# Patient Record
Sex: Male | Born: 1970 | Race: White | Hispanic: No | Smoking: Current every day smoker
Health system: Southern US, Community
[De-identification: ages and names within clinical notes are randomized; demographics above are authoritative.]

## PROBLEM LIST (undated history)

## (undated) DIAGNOSIS — R0600 Dyspnea, unspecified: Secondary | ICD-10-CM

## (undated) DIAGNOSIS — M1990 Unspecified osteoarthritis, unspecified site: Secondary | ICD-10-CM

## (undated) DIAGNOSIS — K219 Gastro-esophageal reflux disease without esophagitis: Secondary | ICD-10-CM

## (undated) DIAGNOSIS — K259 Gastric ulcer, unspecified as acute or chronic, without hemorrhage or perforation: Secondary | ICD-10-CM

## (undated) DIAGNOSIS — S62101A Fracture of unspecified carpal bone, right wrist, initial encounter for closed fracture: Secondary | ICD-10-CM

## (undated) HISTORY — PX: LEG SURGERY: SHX1003

## (undated) HISTORY — PX: CARPAL TUNNEL RELEASE: SHX101

## (undated) HISTORY — PX: KNEE SURGERY: SHX244

## (undated) HISTORY — PX: KNEE ARTHROSCOPY: SUR90

## (undated) HISTORY — PX: NECK SURGERY: SHX720

---

## 2016-05-17 ENCOUNTER — Other Ambulatory Visit: Payer: Self-pay | Admitting: Neurosurgery

## 2016-07-11 ENCOUNTER — Other Ambulatory Visit (HOSPITAL_COMMUNITY): Payer: Self-pay

## 2016-08-08 ENCOUNTER — Encounter (HOSPITAL_COMMUNITY): Payer: Self-pay

## 2016-08-08 ENCOUNTER — Encounter (HOSPITAL_COMMUNITY)
Admission: RE | Admit: 2016-08-08 | Discharge: 2016-08-08 | Disposition: A | Discharge status: Home / Self Care | Payer: Medicaid Other | Source: Ambulatory Visit | Attending: Neurosurgery | Admitting: Neurosurgery

## 2016-08-08 DIAGNOSIS — Z0183 Encounter for blood typing: Secondary | ICD-10-CM | POA: Insufficient documentation

## 2016-08-08 DIAGNOSIS — M4310 Spondylolisthesis, site unspecified: Secondary | ICD-10-CM | POA: Diagnosis not present

## 2016-08-08 DIAGNOSIS — Z01812 Encounter for preprocedural laboratory examination: Secondary | ICD-10-CM | POA: Diagnosis not present

## 2016-08-08 HISTORY — DX: Unspecified osteoarthritis, unspecified site: M19.90

## 2016-08-08 HISTORY — DX: Gastric ulcer, unspecified as acute or chronic, without hemorrhage or perforation: K25.9

## 2016-08-08 HISTORY — DX: Fracture of unspecified carpal bone, right wrist, initial encounter for closed fracture: S62.101A

## 2016-08-08 HISTORY — DX: Gastro-esophageal reflux disease without esophagitis: K21.9

## 2016-08-08 HISTORY — DX: Dyspnea, unspecified: R06.00

## 2016-08-08 LAB — BASIC METABOLIC PANEL
Anion gap: 9 (ref 5–15)
BUN: 10 mg/dL (ref 6–20)
CALCIUM: 9.6 mg/dL (ref 8.9–10.3)
CHLORIDE: 101 mmol/L (ref 101–111)
CO2: 27 mmol/L (ref 22–32)
CREATININE: 0.84 mg/dL (ref 0.61–1.24)
Glucose, Bld: 107 mg/dL — ABNORMAL HIGH (ref 65–99)
Potassium: 3.9 mmol/L (ref 3.5–5.1)
Sodium: 137 mmol/L (ref 135–145)

## 2016-08-08 LAB — CBC
HEMATOCRIT: 45 % (ref 39.0–52.0)
HEMOGLOBIN: 15.5 g/dL (ref 13.0–17.0)
MCH: 30.7 pg (ref 26.0–34.0)
MCHC: 34.4 g/dL (ref 30.0–36.0)
MCV: 89.1 fL (ref 78.0–100.0)
Platelets: 277 10*3/uL (ref 150–400)
RBC: 5.05 MIL/uL (ref 4.22–5.81)
RDW: 12.4 % (ref 11.5–15.5)
WBC: 9.3 10*3/uL (ref 4.0–10.5)

## 2016-08-08 LAB — TYPE AND SCREEN
ABO/RH(D): A POS
Antibody Screen: NEGATIVE

## 2016-08-08 LAB — SURGICAL PCR SCREEN
MRSA, PCR: NEGATIVE
STAPHYLOCOCCUS AUREUS: NEGATIVE

## 2016-08-08 LAB — ABO/RH: ABO/RH(D): A POS

## 2016-08-08 NOTE — Pre-Procedure Instructions (Signed)
Providence CrosbyChristopher Edward Watt  08/08/2016      [facility] - [place], [place] - [address] [place] [id number] Phone: [phone] Fax: [phone]    Your procedure is scheduled on   Thursday   08/18/16  [facility].M.  Call this number if you have problems the morning of surgery:  [phone]   Remember:  Do not eat food or drink liquids after midnight.  Take these medicines the morning of surgery with A SIP OF WATER   ALBUTEROL INHALER, OMEPRAZOLE (PRILOSEC), OXYCODONE IF NEEDED  7 days prior to surgery STOP taking any Aspirin, Aleve, Naproxen, Ibuprofen, Motrin, Advil, Goody's, BC's, all herbal medications, fish oil, and all vitamins   Do not wear jewelry, make-up or nail polish.  Do not wear lotions, powders, or perfumes, or deoderant.  Do not shave 48 hours prior to surgery.  Men may shave face and neck.  Do not bring valuables to the hospital.  [facility] any belongings or valuables.  Contacts, dentures or bridgework may not be worn into surgery.  Leave your suitcase in the car.  After surgery it may be brought to your room.  For patients admitted to the hospital, discharge time will be determined by your treatment team.  Patients discharged the day of surgery will not be allowed to drive home.   Name and phone number of your driver:    Special instructions:  [place] - Preparing for Surgery  Before surgery, you can play an important role.  Because skin is not sterile, your skin needs to be as free of germs as possible.  You can reduce the number of germs on you skin by washing with CHG (chlorahexidine gluconate) soap before surgery.  CHG is an antiseptic cleaner which kills germs and bonds with the skin to continue killing germs even after washing.  Please DO NOT use if you have an allergy to CHG or antibacterial soaps.  If your skin becomes reddened/irritated stop using the CHG and  inform your nurse when you arrive at Short Stay.  Do not shave (including legs and underarms) for at least 48 hours prior to the first CHG shower.  You may shave your face.  Please follow these instructions carefully:   1.  Shower with CHG Soap the night before surgery and the                                morning of Surgery.  2.  If you choose to wash your hair, wash your hair first as usual with your       normal shampoo.  3.  After you shampoo, rinse your hair and body thoroughly to remove the                      Shampoo.  4.  Use CHG as you would any other liquid soap.  You can apply chg directly       to the skin and wash gently with scrungie or a clean washcloth.  5.  Apply the CHG Soap to your body ONLY FROM THE NECK DOWN.        Do not use on open wounds or open sores.  Avoid contact with your eyes,       ears, mouth and genitals (private parts).  Wash genitals (private parts)  with your normal soap.  6.  Wash thoroughly, paying special attention to the area where your surgery        will be performed.  7.  Thoroughly rinse your body with warm water from the neck down.  8.  DO NOT shower/wash with your normal soap after using and rinsing off       the CHG Soap.  9.  Pat yourself dry with a clean towel.            10.  Wear clean pajamas.            11.  Place clean sheets on your bed the night of your first shower and do not        sleep with pets.  Day of Surgery  Do not apply any lotions/deoderants the morning of surgery.  Please wear clean clothes to the hospital/surgery center.    Please read over the following fact sheets that you were given. MRSA Information and Surgical Site Infection Prevention

## 2016-08-17 MED ORDER — CEFAZOLIN SODIUM-DEXTROSE 2-4 GM/100ML-% IV SOLN
2.0000 g | INTRAVENOUS | Status: AC
  Administered 2016-08-18: 2 g via INTRAVENOUS
  Filled 2016-08-17: qty 100

## 2016-08-17 MED ORDER — DEXAMETHASONE SODIUM PHOSPHATE 10 MG/ML IJ SOLN
10.0000 mg | INTRAMUSCULAR | Status: AC
  Administered 2016-08-18: 10 mg via INTRAVENOUS
  Filled 2016-08-17: qty 1

## 2016-08-18 ENCOUNTER — Inpatient Hospital Stay (HOSPITAL_COMMUNITY): Payer: Medicaid Other | Admitting: Certified Registered Nurse Anesthetist

## 2016-08-18 ENCOUNTER — Encounter (HOSPITAL_COMMUNITY)
Admission: RE | Disposition: A | Discharge status: Home / Self Care | Payer: Self-pay | Source: Ambulatory Visit | Attending: Neurosurgery

## 2016-08-18 ENCOUNTER — Inpatient Hospital Stay (HOSPITAL_COMMUNITY)
Admission: RE | Admit: 2016-08-18 | Discharge: 2016-08-21 | DRG: 455 | Disposition: A | Discharge status: Home / Self Care | Payer: Medicaid Other | Source: Ambulatory Visit | Attending: Neurosurgery | Admitting: Neurosurgery

## 2016-08-18 ENCOUNTER — Encounter (HOSPITAL_COMMUNITY): Payer: Self-pay | Admitting: Certified Registered Nurse Anesthetist

## 2016-08-18 ENCOUNTER — Inpatient Hospital Stay (HOSPITAL_COMMUNITY): Payer: Medicaid Other

## 2016-08-18 DIAGNOSIS — K219 Gastro-esophageal reflux disease without esophagitis: Secondary | ICD-10-CM | POA: Diagnosis present

## 2016-08-18 DIAGNOSIS — Z79899 Other long term (current) drug therapy: Secondary | ICD-10-CM | POA: Diagnosis not present

## 2016-08-18 DIAGNOSIS — M48061 Spinal stenosis, lumbar region without neurogenic claudication: Secondary | ICD-10-CM | POA: Diagnosis present

## 2016-08-18 DIAGNOSIS — Z8711 Personal history of peptic ulcer disease: Secondary | ICD-10-CM | POA: Diagnosis not present

## 2016-08-18 DIAGNOSIS — M25461 Effusion, right knee: Secondary | ICD-10-CM | POA: Diagnosis present

## 2016-08-18 DIAGNOSIS — F17290 Nicotine dependence, other tobacco product, uncomplicated: Secondary | ICD-10-CM | POA: Diagnosis present

## 2016-08-18 DIAGNOSIS — M419 Scoliosis, unspecified: Secondary | ICD-10-CM | POA: Diagnosis present

## 2016-08-18 DIAGNOSIS — Z79891 Long term (current) use of opiate analgesic: Secondary | ICD-10-CM

## 2016-08-18 DIAGNOSIS — M5136 Other intervertebral disc degeneration, lumbar region: Secondary | ICD-10-CM | POA: Diagnosis present

## 2016-08-18 DIAGNOSIS — F17210 Nicotine dependence, cigarettes, uncomplicated: Secondary | ICD-10-CM | POA: Diagnosis present

## 2016-08-18 DIAGNOSIS — Z419 Encounter for procedure for purposes other than remedying health state, unspecified: Secondary | ICD-10-CM

## 2016-08-18 DIAGNOSIS — M79609 Pain in unspecified limb: Secondary | ICD-10-CM | POA: Diagnosis not present

## 2016-08-18 DIAGNOSIS — R0781 Pleurodynia: Secondary | ICD-10-CM

## 2016-08-18 HISTORY — PX: LUMBAR PERCUTANEOUS PEDICLE SCREW 1 LEVEL: SHX5560

## 2016-08-18 HISTORY — PX: ANTERIOR LAT LUMBAR FUSION: SHX1168

## 2016-08-18 SURGERY — ANTERIOR LATERAL LUMBAR FUSION 1 LEVEL
Anesthesia: General | Site: Spine Lumbar

## 2016-08-18 MED ORDER — OXYCODONE HCL 5 MG PO TABS
10.0000 mg | ORAL_TABLET | Freq: Three times a day (TID) | ORAL | Status: DC
  Administered 2016-08-18 – 2016-08-21 (×8): 10 mg via ORAL
  Filled 2016-08-18 (×9): qty 2

## 2016-08-18 MED ORDER — ONDANSETRON HCL 4 MG PO TABS
4.0000 mg | ORAL_TABLET | Freq: Four times a day (QID) | ORAL | Status: DC | PRN
Start: 2016-08-18 — End: 2016-08-21

## 2016-08-18 MED ORDER — FENTANYL CITRATE (PF) 250 MCG/5ML IJ SOLN
INTRAMUSCULAR | Status: AC
  Filled 2016-08-18: qty 5

## 2016-08-18 MED ORDER — PROMETHAZINE HCL 25 MG/ML IJ SOLN: 6.2500 mg | INTRAMUSCULAR | Status: DC | PRN

## 2016-08-18 MED ORDER — IBUPROFEN 200 MG PO TABS
800.0000 mg | ORAL_TABLET | Freq: Two times a day (BID) | ORAL | Status: DC | PRN
  Administered 2016-08-19: 800 mg via ORAL
  Filled 2016-08-18: qty 4

## 2016-08-18 MED ORDER — FENTANYL CITRATE (PF) 100 MCG/2ML IJ SOLN
INTRAMUSCULAR | Status: DC | PRN
Start: 2016-08-18 — End: 2016-08-18
  Administered 2016-08-18 (×4): 50 ug via INTRAVENOUS
  Administered 2016-08-18: 100 ug via INTRAVENOUS
  Administered 2016-08-18: 50 ug via INTRAVENOUS
  Administered 2016-08-18: 100 ug via INTRAVENOUS

## 2016-08-18 MED ORDER — ACETAMINOPHEN 325 MG PO TABS
650.0000 mg | ORAL_TABLET | ORAL | Status: DC | PRN
  Administered 2016-08-19: 650 mg via ORAL
  Filled 2016-08-18: qty 2

## 2016-08-18 MED ORDER — ONDANSETRON HCL 4 MG/2ML IJ SOLN
INTRAMUSCULAR | Status: AC
  Filled 2016-08-18: qty 2

## 2016-08-18 MED ORDER — SURGIFOAM 100 EX MISC
CUTANEOUS | Status: DC | PRN
  Administered 2016-08-18: 20 mL via TOPICAL

## 2016-08-18 MED ORDER — PROPOFOL 10 MG/ML IV BOLUS
INTRAVENOUS | Status: DC | PRN
  Administered 2016-08-18: 120 mg via INTRAVENOUS

## 2016-08-18 MED ORDER — SODIUM CHLORIDE 0.9 % IV SOLN
250.0000 mL | INTRAVENOUS | Status: DC
  Administered 2016-08-18: 250 mL via INTRAVENOUS

## 2016-08-18 MED ORDER — SUCCINYLCHOLINE CHLORIDE 20 MG/ML IJ SOLN
INTRAMUSCULAR | Status: DC | PRN
  Administered 2016-08-18: 100 mg via INTRAVENOUS

## 2016-08-18 MED ORDER — ONDANSETRON HCL 4 MG/2ML IJ SOLN
INTRAMUSCULAR | Status: DC | PRN
  Administered 2016-08-18: 4 mg via INTRAVENOUS

## 2016-08-18 MED ORDER — LIDOCAINE HCL (CARDIAC) 20 MG/ML IV SOLN
INTRAVENOUS | Status: DC | PRN
  Administered 2016-08-18: 60 mg via INTRAVENOUS

## 2016-08-18 MED ORDER — PANTOPRAZOLE SODIUM 40 MG PO TBEC
40.0000 mg | DELAYED_RELEASE_TABLET | Freq: Every day | ORAL | Status: DC
  Administered 2016-08-19 – 2016-08-21 (×3): 40 mg via ORAL
  Filled 2016-08-18 (×3): qty 1

## 2016-08-18 MED ORDER — SODIUM CHLORIDE 0.9% FLUSH
3.0000 mL | Freq: Two times a day (BID) | INTRAVENOUS | Status: DC
  Administered 2016-08-18: 10 mL via INTRAVENOUS
  Administered 2016-08-21: 3 mL via INTRAVENOUS

## 2016-08-18 MED ORDER — ALBUTEROL SULFATE HFA 108 (90 BASE) MCG/ACT IN AERS: 2.0000 | INHALATION_SPRAY | Freq: Four times a day (QID) | RESPIRATORY_TRACT | Status: DC | PRN

## 2016-08-18 MED ORDER — CYCLOBENZAPRINE HCL 10 MG PO TABS
10.0000 mg | ORAL_TABLET | Freq: Three times a day (TID) | ORAL | Status: DC | PRN
  Administered 2016-08-19 – 2016-08-20 (×2): 10 mg via ORAL
  Filled 2016-08-18 (×2): qty 1

## 2016-08-18 MED ORDER — SUGAMMADEX SODIUM 200 MG/2ML IV SOLN
INTRAVENOUS | Status: DC | PRN
  Administered 2016-08-18: 150 mg via INTRAVENOUS

## 2016-08-18 MED ORDER — HYDROMORPHONE HCL 1 MG/ML IJ SOLN
0.2500 mg | INTRAMUSCULAR | Status: DC | PRN
  Administered 2016-08-18 (×2): 0.5 mg via INTRAVENOUS

## 2016-08-18 MED ORDER — LIDOCAINE-EPINEPHRINE 1 %-1:100000 IJ SOLN
INTRAMUSCULAR | Status: DC | PRN
  Administered 2016-08-18: 8 mL
  Administered 2016-08-18: 2.5 mL

## 2016-08-18 MED ORDER — LACTATED RINGERS IV SOLN
INTRAVENOUS | Status: DC
  Administered 2016-08-18 (×4): via INTRAVENOUS

## 2016-08-18 MED ORDER — PHENYLEPHRINE 40 MCG/ML (10ML) SYRINGE FOR IV PUSH (FOR BLOOD PRESSURE SUPPORT)
PREFILLED_SYRINGE | INTRAVENOUS | Status: AC
  Filled 2016-08-18: qty 10

## 2016-08-18 MED ORDER — PHENOL 1.4 % MT LIQD: 1.0000 | OROMUCOSAL | Status: DC | PRN

## 2016-08-18 MED ORDER — HYDROMORPHONE HCL 1 MG/ML IJ SOLN
INTRAMUSCULAR | Status: AC
  Filled 2016-08-18: qty 0.5

## 2016-08-18 MED ORDER — ACETAMINOPHEN 10 MG/ML IV SOLN
INTRAVENOUS | Status: DC | PRN
  Administered 2016-08-18: 1000 mg via INTRAVENOUS

## 2016-08-18 MED ORDER — ALBUTEROL SULFATE (2.5 MG/3ML) 0.083% IN NEBU: 2.5000 mg | INHALATION_SOLUTION | Freq: Four times a day (QID) | RESPIRATORY_TRACT | Status: DC | PRN

## 2016-08-18 MED ORDER — OXYCODONE HCL 5 MG PO TABS
5.0000 mg | ORAL_TABLET | ORAL | Status: DC | PRN
  Administered 2016-08-19: 5 mg via ORAL
  Administered 2016-08-19 – 2016-08-21 (×7): 10 mg via ORAL
  Filled 2016-08-18 (×8): qty 2

## 2016-08-18 MED ORDER — BUPIVACAINE-EPINEPHRINE 0.25% -1:200000 IJ SOLN
INTRAMUSCULAR | Status: DC | PRN
  Administered 2016-08-18: 2.5 mL

## 2016-08-18 MED ORDER — MIDAZOLAM HCL 5 MG/5ML IJ SOLN
INTRAMUSCULAR | Status: DC | PRN
  Administered 2016-08-18: 2 mg via INTRAVENOUS

## 2016-08-18 MED ORDER — SODIUM CHLORIDE 0.9 % IR SOLN
Status: DC | PRN
  Administered 2016-08-18: 500 mL

## 2016-08-18 MED ORDER — PANTOPRAZOLE SODIUM 40 MG IV SOLR: 40.0000 mg | Freq: Every day | INTRAVENOUS | Status: DC

## 2016-08-18 MED ORDER — CHLORHEXIDINE GLUCONATE CLOTH 2 % EX PADS: 6.0000 | MEDICATED_PAD | Freq: Once | CUTANEOUS | Status: DC

## 2016-08-18 MED ORDER — LAMOTRIGINE 100 MG PO TABS
100.0000 mg | ORAL_TABLET | Freq: Every day | ORAL | Status: DC
  Administered 2016-08-18 – 2016-08-20 (×3): 100 mg via ORAL
  Filled 2016-08-18 (×3): qty 1

## 2016-08-18 MED ORDER — PHENYLEPHRINE HCL 10 MG/ML IJ SOLN
INTRAVENOUS | Status: DC | PRN
  Administered 2016-08-18: 35 ug/min via INTRAVENOUS

## 2016-08-18 MED ORDER — THROMBIN 5000 UNITS EX SOLR
CUTANEOUS | Status: AC
  Filled 2016-08-18: qty 5000

## 2016-08-18 MED ORDER — PROPOFOL 10 MG/ML IV BOLUS
INTRAVENOUS | Status: AC
  Filled 2016-08-18: qty 20

## 2016-08-18 MED ORDER — HYDROMORPHONE HCL 1 MG/ML IJ SOLN
INTRAMUSCULAR | Status: AC
Start: 2016-08-18 — End: 2016-08-19
  Filled 2016-08-18: qty 0.5

## 2016-08-18 MED ORDER — LIDOCAINE-EPINEPHRINE 1 %-1:100000 IJ SOLN
INTRAMUSCULAR | Status: AC
  Filled 2016-08-18: qty 1

## 2016-08-18 MED ORDER — HYDROMORPHONE HCL 1 MG/ML IJ SOLN
0.5000 mg | INTRAMUSCULAR | Status: DC | PRN
  Administered 2016-08-18 – 2016-08-21 (×7): 0.5 mg via INTRAVENOUS
  Filled 2016-08-18 (×7): qty 0.5

## 2016-08-18 MED ORDER — FLUTICASONE PROPIONATE 50 MCG/ACT NA SUSP
1.0000 | Freq: Every day | NASAL | Status: DC
  Administered 2016-08-19 – 2016-08-21 (×3): 1 via NASAL
  Filled 2016-08-18 (×2): qty 16

## 2016-08-18 MED ORDER — BUPIVACAINE HCL (PF) 0.25 % IJ SOLN
INTRAMUSCULAR | Status: AC
  Filled 2016-08-18: qty 30

## 2016-08-18 MED ORDER — 0.9 % SODIUM CHLORIDE (POUR BTL) OPTIME
TOPICAL | Status: DC | PRN
  Administered 2016-08-18: 1000 mL

## 2016-08-18 MED ORDER — OXYCODONE HCL 10 MG PO TABS: 10.0000 mg | ORAL_TABLET | Freq: Three times a day (TID) | ORAL | Status: DC

## 2016-08-18 MED ORDER — ONDANSETRON HCL 4 MG/2ML IJ SOLN: 4.0000 mg | Freq: Four times a day (QID) | INTRAMUSCULAR | Status: DC | PRN

## 2016-08-18 MED ORDER — ROCURONIUM BROMIDE 100 MG/10ML IV SOLN
INTRAVENOUS | Status: DC | PRN
  Administered 2016-08-18: 25 mg via INTRAVENOUS

## 2016-08-18 MED ORDER — ACETAMINOPHEN 650 MG RE SUPP: 650.0000 mg | RECTAL | Status: DC | PRN

## 2016-08-18 MED ORDER — CEFAZOLIN SODIUM-DEXTROSE 2-4 GM/100ML-% IV SOLN
2.0000 g | Freq: Three times a day (TID) | INTRAVENOUS | Status: AC
  Administered 2016-08-18 – 2016-08-19 (×2): 2 g via INTRAVENOUS
  Filled 2016-08-18 (×2): qty 100

## 2016-08-18 MED ORDER — ACETAMINOPHEN 10 MG/ML IV SOLN
INTRAVENOUS | Status: AC
  Filled 2016-08-18: qty 100

## 2016-08-18 MED ORDER — ALUM & MAG HYDROXIDE-SIMETH 200-200-20 MG/5ML PO SUSP: 30.0000 mL | Freq: Four times a day (QID) | ORAL | Status: DC | PRN

## 2016-08-18 MED ORDER — KETAMINE HCL-SODIUM CHLORIDE 100-0.9 MG/10ML-% IV SOSY
PREFILLED_SYRINGE | INTRAVENOUS | Status: AC
  Filled 2016-08-18: qty 10

## 2016-08-18 MED ORDER — MIDAZOLAM HCL 2 MG/2ML IJ SOLN
INTRAMUSCULAR | Status: AC
  Filled 2016-08-18: qty 2

## 2016-08-18 MED ORDER — MENTHOL 3 MG MT LOZG: 1.0000 | LOZENGE | OROMUCOSAL | Status: DC | PRN

## 2016-08-18 MED ORDER — KETAMINE HCL 10 MG/ML IJ SOLN
INTRAMUSCULAR | Status: DC | PRN
  Administered 2016-08-18 (×3): 10 mg via INTRAVENOUS
  Administered 2016-08-18: 40 mg via INTRAVENOUS

## 2016-08-18 MED ORDER — SODIUM CHLORIDE 0.9% FLUSH: 3.0000 mL | INTRAVENOUS | Status: DC | PRN

## 2016-08-18 MED ORDER — THROMBIN 20000 UNITS EX SOLR
CUTANEOUS | Status: AC
  Filled 2016-08-18: qty 20000

## 2016-08-18 SURGICAL SUPPLY — 82 items
BAG DECANTER FOR FLEXI CONT (MISCELLANEOUS) ×3 IMPLANT
BENZOIN TINCTURE PRP APPL 2/3 (GAUZE/BANDAGES/DRESSINGS) ×3 IMPLANT
BLADE CLIPPER SURG (BLADE) IMPLANT
BONE MATRIX OSTEOCEL PRO MED (Bone Implant) ×6 IMPLANT
BUR MATCHSTICK NEURO 3.0 LAGG (BURR) ×3 IMPLANT
CAP RELINE MOD TULIP RMM (Cap) ×6 IMPLANT
CARTRIDGE OIL MAESTRO DRILL (MISCELLANEOUS) ×1 IMPLANT
CLOSURE WOUND 1/2 X4 (GAUZE/BANDAGES/DRESSINGS) ×1
CONT SPEC 4OZ CLIKSEAL STRL BL (MISCELLANEOUS) ×3 IMPLANT
COVER BACK TABLE 24X17X13 BIG (DRAPES) IMPLANT
COVER BACK TABLE 60X90IN (DRAPES) ×3 IMPLANT
DERMABOND ADVANCED (GAUZE/BANDAGES/DRESSINGS) ×4
DERMABOND ADVANCED .7 DNX12 (GAUZE/BANDAGES/DRESSINGS) ×2 IMPLANT
DIFFUSER DRILL AIR PNEUMATIC (MISCELLANEOUS) ×3 IMPLANT
DRAPE C-ARM 42X72 X-RAY (DRAPES) ×6 IMPLANT
DRAPE C-ARMOR (DRAPES) ×6 IMPLANT
DRAPE LAPAROTOMY 100X72X124 (DRAPES) ×6 IMPLANT
DRAPE POUCH INSTRU U-SHP 10X18 (DRAPES) IMPLANT
DRAPE SURG 17X23 STRL (DRAPES) ×6 IMPLANT
DRSG OPSITE 4X5.5 SM (GAUZE/BANDAGES/DRESSINGS) ×3 IMPLANT
DRSG OPSITE POSTOP 4X6 (GAUZE/BANDAGES/DRESSINGS) ×9 IMPLANT
ELECT BLADE 4.0 EZ CLEAN MEGAD (MISCELLANEOUS) ×3
ELECT REM PT RETURN 9FT ADLT (ELECTROSURGICAL) ×6
ELECTRODE BLDE 4.0 EZ CLN MEGD (MISCELLANEOUS) ×1 IMPLANT
ELECTRODE REM PT RTRN 9FT ADLT (ELECTROSURGICAL) ×2 IMPLANT
EVACUATOR 1/8 PVC DRAIN (DRAIN) ×3 IMPLANT
GAUZE SPONGE 4X4 12PLY STRL (GAUZE/BANDAGES/DRESSINGS) IMPLANT
GAUZE SPONGE 4X4 16PLY XRAY LF (GAUZE/BANDAGES/DRESSINGS) IMPLANT
GLOVE BIO SURGEON STRL SZ7 (GLOVE) ×3 IMPLANT
GLOVE BIO SURGEON STRL SZ8 (GLOVE) ×12 IMPLANT
GLOVE BIO SURGEON STRL SZ8.5 (GLOVE) ×6 IMPLANT
GLOVE BIOGEL PI IND STRL 7.0 (GLOVE) ×1 IMPLANT
GLOVE BIOGEL PI IND STRL 7.5 (GLOVE) ×3 IMPLANT
GLOVE BIOGEL PI INDICATOR 7.0 (GLOVE) ×2
GLOVE BIOGEL PI INDICATOR 7.5 (GLOVE) ×6
GLOVE ECLIPSE 7.0 STRL STRAW (GLOVE) ×9 IMPLANT
GLOVE EXAM NITRILE LRG STRL (GLOVE) IMPLANT
GLOVE EXAM NITRILE XL STR (GLOVE) IMPLANT
GLOVE EXAM NITRILE XS STR PU (GLOVE) IMPLANT
GLOVE INDICATOR 8.5 STRL (GLOVE) ×6 IMPLANT
GLOVE SURG SS PI 7.0 STRL IVOR (GLOVE) ×3 IMPLANT
GOWN STRL REUS W/ TWL LRG LVL3 (GOWN DISPOSABLE) ×2 IMPLANT
GOWN STRL REUS W/ TWL XL LVL3 (GOWN DISPOSABLE) ×2 IMPLANT
GOWN STRL REUS W/TWL 2XL LVL3 (GOWN DISPOSABLE) ×6 IMPLANT
GOWN STRL REUS W/TWL LRG LVL3 (GOWN DISPOSABLE) ×4
GOWN STRL REUS W/TWL XL LVL3 (GOWN DISPOSABLE) ×4
IMPL COROENT XL 8X8X45 (Cage) ×1 IMPLANT
IMPLANT COROENT XL 8X8X45 (Cage) ×3 IMPLANT
KIT BASIN OR (CUSTOM PROCEDURE TRAY) ×3 IMPLANT
KIT DILATOR XLIF 5 (KITS) ×1 IMPLANT
KIT ROOM TURNOVER OR (KITS) ×3 IMPLANT
KIT SURGICAL ACCESS MAXCESS 4 (KITS) ×3 IMPLANT
KIT XLIF (KITS) ×2
MODULE NVM5 NEXT GEN EMG (NEEDLE) ×3 IMPLANT
NEEDLE HYPO 22GX1.5 SAFETY (NEEDLE) ×3 IMPLANT
NEEDLE HYPO 25X1 1.5 SAFETY (NEEDLE) ×3 IMPLANT
NS IRRIG 1000ML POUR BTL (IV SOLUTION) ×3 IMPLANT
OIL CARTRIDGE MAESTRO DRILL (MISCELLANEOUS) ×3
PACK LAMINECTOMY NEURO (CUSTOM PROCEDURE TRAY) ×6 IMPLANT
ROD RELINE O COCR 5.0X50MM (Rod) ×3 IMPLANT
ROD RELINE-O COCR 5.0X55MM (Rod) ×3 IMPLANT
SCREW 5X30MM POLYAXIAL 4S (Screw) ×6 IMPLANT
SCREW LOCK RSS 4.5/5.0MM (Screw) ×12 IMPLANT
SCREW SHANK RELINE MOD 5.0X30 (Screw) ×6 IMPLANT
SPONGE LAP 4X18 X RAY DECT (DISPOSABLE) IMPLANT
SPONGE SURGIFOAM ABS GEL SZ50 (HEMOSTASIS) IMPLANT
STAPLER SKIN PROX WIDE 3.9 (STAPLE) IMPLANT
STRIP CLOSURE SKIN 1/2X4 (GAUZE/BANDAGES/DRESSINGS) ×2 IMPLANT
SUT VIC AB 0 CT1 18XCR BRD8 (SUTURE) ×1 IMPLANT
SUT VIC AB 0 CT1 8-18 (SUTURE) ×2
SUT VIC AB 2-0 CT1 18 (SUTURE) ×12 IMPLANT
SUT VIC AB 2-0 CT1 27 (SUTURE) ×4
SUT VIC AB 2-0 CT1 TAPERPNT 27 (SUTURE) ×2 IMPLANT
SUT VIC AB 4-0 P-3 18X BRD (SUTURE) IMPLANT
SUT VIC AB 4-0 P3 18 (SUTURE)
SUT VIC AB 4-0 PS2 27 (SUTURE) ×9 IMPLANT
SUT VICRYL 4-0 PS2 18IN ABS (SUTURE) IMPLANT
SWABSTICK BENZOIN STERILE (MISCELLANEOUS) IMPLANT
TOWEL GREEN STERILE (TOWEL DISPOSABLE) ×6 IMPLANT
TOWEL GREEN STERILE FF (TOWEL DISPOSABLE) ×6 IMPLANT
TRAY FOLEY W/METER SILVER 16FR (SET/KITS/TRAYS/PACK) ×3 IMPLANT
WATER STERILE IRR 1000ML POUR (IV SOLUTION) ×3 IMPLANT

## 2016-08-18 NOTE — Op Note (Signed)
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   Postoperative diagnosis: Same.  Procedure: #1 anterolateral interbody fusion through a left-sided approach at L1-L2 utilizing the nuvasive XLIF anterolateral system with placement of an 8 mm parallel cage packed with Osteocel pro with intraoperative neural monitoring  #2 through separate skin incision patient was repositioned posteriorly for cortical screw placement utilizing the cobalt chrome new invasive cortical screw set at L1-L2 with posterior lateral arthrodesis L1-L2  Surgeon: Jillyn Hidden Nivan Melendrez  Asst.: Tressie Stalker  Anesthesia: Gen.  EBL: Minimal  History of present illness: Patient is a 46 year old some is a long-standing back pain progressive worsening over last several years refractory to all forms conservative treatment imaging revealed progressive degenerative collapse at L1-L2 with scoliotic deformity Modic changes and spinal stenosis. Due to patient's progression of clinical syndrome imaging findings and failure conservative treatment I recommended anterior lateral interbody fusion with posterior augmentation with cortical screws extensively went over the risks and benefits of the operation with him as well as perioperative course expectations of outcome and alternatives of surgery and he understood and agreed to proceed forward.  Operative procedure: Patient brought into the or was initially induced under general anesthesia without muscle relaxer patient was positioned left side up in the lateral position utilizing fluoroscopy we identified the L1-L2 disc space position the patient in slight flexion to open up the L1-L2 disc space on the left. After adequate imaging a been achieved the localization 2 incisions were made one posteriorly 1 laterally to gain access to the right.. I into the posterior incision identified transverse process sweep the  retroperitoneum and pleura off the undersurface of both the 12th and the 11th rib and then passing the dilator over my finger back down into the paraspinal musculature and utilizing fluoroscopy I entered and then selected the posterior aspect of the L1-2 disc space. Inserted the Steinmann pin and then used sequential distraction with dilators utilizing intraoperative neural monitoring during the entire placement of each dilator no readings came in less than 20. Then retractor was selected and this was again placed and anchored in place opened up the retractor inspected the Steinmann pin utilizing AP and lateral fluoroscopy I selected a posterior entry point for the shim and confirmed good position with fluoroscopy. Stimulated the entire area around the operative field again with no readings that were less than 20. Then incised the disc space and clean the disc space out due to rongeurs utilized small Cobbs to release the contralateral annulus. Then after adequate discectomy was achieved utilizing paddle opened up the disc space and reduce the scoliotic deformity utilizing an 8 parallel trial this felt to be adequate scoliotic reduction of the deformity and also opened up the disc space adequately. So at the size of cage I selected and packed with the osteo-cell pro. Then inserted this with utilizing slides under fluoroscopy until it was in good position. There removed the slides confirmed adequate position with fluoroscopy inspected the operative field and saw no violation of the pleura or retroperitoneum wound was copious irrigated meticulous hemostasis was maintained both incisions were closed with with interrupted Vicryl and a running 4 subcuticular. Wounds were then dressed the patient was then repositioned for the posterior part of the case.  Once the patient was positioned prone on the Wilson frame localize film proved and confirmed localization of the L1-2 disc space I infiltration of 10 mL lidocaine with  epi a midline incision was made and  subperiosteal dissections care lamina of L1 and L2 I then identified the entry points at the L1-L2 pedicle utilizing AP and lateral fluoroscopy then all cortical screw holes were drilled to 25 mm tapped probed and 50 by 30 mm cortical screws were placed at L1 and L2 bilaterally. All screws excellent purchase. Ross be confirmed good position of all the screws I then aggressively decorticated the facet complex and lateral pars and packed the osteo-cell pro posterior laterally bilaterally. Then assembled ahead and 2 rods were selected all the knots were anchored in place a medium Hemovac drain was placed the wound scope was irrigated and the wounds closed in layers without Vicryl and a running 4 septic or Dermabond benzo and Steri-Strips and sterile dressings applied patient recovered in stable condition. At the end of case all needle counts and sponge counts were correct.

## 2016-08-18 NOTE — OR Nursing (Signed)
When patient awakened for pain assessment, he stated he was a 7/10.  His HR 70, BP 124/83, RR 12 and oxygen saturations 99% on 2L [place].  Pt immediately falls back asleep.  Pupils are pinpoint. No further narcotics will be given at this time.

## 2016-08-18 NOTE — Anesthesia Preprocedure Evaluation (Signed)
Anesthesia Evaluation  Patient identified by MRN, date of birth, ID band Patient awake    Reviewed: Allergy & Precautions, NPO status , Patient's Chart, lab work & pertinent test results  Airway Mallampati: II  TM Distance: >3 FB Neck ROM: Full    Dental no notable dental hx.    Pulmonary Current Smoker,    Pulmonary exam normal breath sounds clear to auscultation       Cardiovascular negative cardio ROS Normal cardiovascular exam Rhythm:Regular Rate:Normal     Neuro/Psych negative neurological ROS  negative psych ROS   GI/Hepatic Neg liver ROS, GERD  Medicated,  Endo/Other  negative endocrine ROS  Renal/GU negative Renal ROS  negative genitourinary   Musculoskeletal negative musculoskeletal ROS (+)   Abdominal   Peds negative pediatric ROS (+)  Hematology negative hematology ROS (+)   Anesthesia Other Findings   Reproductive/Obstetrics negative OB ROS                             Anesthesia Physical Anesthesia Plan  ASA: II  Anesthesia Plan: General   Post-op Pain Management:    Induction: Intravenous  PONV Risk Score and Plan: 1 and Ondansetron and Dexamethasone  Airway Management Planned: Oral ETT  Additional Equipment:   Intra-op Plan:   Post-operative Plan: Extubation in OR  Informed Consent: I have reviewed the patients History and Physical, chart, labs and discussed the procedure including the risks, benefits and alternatives for the proposed anesthesia with the patient or authorized representative who has indicated his/her understanding and acceptance.   Dental advisory given  Plan Discussed with: CRNA and Surgeon  Anesthesia Plan Comments:         Anesthesia Quick Evaluation

## 2016-08-18 NOTE — Transfer of Care (Signed)
Immediate Anesthesia Transfer of Care Note  Patient: [name]  Procedure(s) Performed: Procedure(s): Lumbar One-Two ANTERIOR LATERAL LUMBAR FUSION with Cortical Screws (N/A) LUMBAR PERCUTANEOUS PEDICLE SCREW LUMBAR ONE-TWO (N/A)  Patient Location: PACU  Anesthesia Type:General  Level of Consciousness: awake  Airway & Oxygen Therapy: Patient Spontanous Breathing and Patient connected to face mask oxygen  Post-op Assessment: Report given to RN, Post -op Vital signs reviewed and stable and Patient moving all extremities X 4  Post vital signs: Reviewed and stable  Last Vitals:  Vitals:   08/18/16 1049  BP: 130/74  Pulse: 93  Resp: 18  Temp: 36.8 C    Last Pain:  Vitals:   08/18/16 1845  PainSc: (P) Asleep         Complications: No apparent anesthesia complications

## 2016-08-18 NOTE — Anesthesia Procedure Notes (Signed)
Procedure Name: Intubation Date/Time: 08/18/2016 2:24 PM Performed by: Little IshikawaMERCER, Thuan Tippett L Pre-anesthesia Checklist: Patient identified, Emergency Drugs available, Suction available and Patient being monitored Patient Re-evaluated:Patient Re-evaluated prior to inductionOxygen Delivery Method: Circle System Utilized Preoxygenation: Pre-oxygenation with 100% oxygen Intubation Type: IV induction Ventilation: Mask ventilation without difficulty Laryngoscope Size: Glidescope and 4 Grade View: Grade I Tube type: Oral Tube size: 7.5 mm Number of attempts: 1 Airway Equipment and Method: Stylet and Oral airway Placement Confirmation: ETT inserted through vocal cords under direct vision,  positive ETCO2 and breath sounds checked- equal and bilateral Secured at: 22 cm Tube secured with: Tape Dental Injury: Teeth and Oropharynx as per pre-operative assessment  Difficulty Due To: Difficulty was anticipated, Difficult Airway- due to reduced neck mobility and Difficult Airway- due to limited oral opening

## 2016-08-18 NOTE — H&P (Signed)
Nathan [name] is an 46 y.o. male.   Chief Complaint: Back HPI: A 46 year old gentleman with a long-standing history with his back severe upper lumbar pain with some occasional radiation the both hips. We'll follow this for several years undergone physical therapy epidural steroid injection facet blocks and serial imaging showed progressive degeneration and collapse with instability and scoliotic deformity at L1-L2. Due to patient's failure conservative treatment imaging findings and progression of clinical syndrome I recommended anterior lateral interbody fusion L1-L2 with posterior augmentation with cortical screws. I've extensively gone over the risks and benefits of the procedure with him as well as perioperative course expectations of outcome and alternatives surgery and he understands and agrees to proceed forward.  Past Medical History:  Diagnosis Date  . Dyspnea   . GERD (gastroesophageal reflux disease)   . Multiple gastric ulcers   . Osteoarthritis   . Wrist fracture, right     Past Surgical History:  Procedure Laterality Date  . CARPAL TUNNEL RELEASE Right   . KNEE ARTHROSCOPY Right   . KNEE SURGERY Left    46 years old  . LEG SURGERY Right    hardware placed  . NECK SURGERY      Family History  Problem Relation Age of Onset  . Diabetes Mother   . Lung cancer Father   . Coronary artery disease Father   . Heart attack Father    Social History:  reports that he has been smoking Cigarettes and Cigars.  He has a 35.00 pack-year smoking history. He has never used smokeless tobacco. He reports that he drinks alcohol. He reports that he does not use drugs.  Allergies:  Allergies  Allergen Reactions  . No Known Allergies     Medications Prior to Admission  Medication Sig Dispense Refill  . albuterol (PROVENTIL HFA;VENTOLIN HFA) 108 (90 Base) MCG/ACT inhaler Inhale 2 puffs into the lungs every 6 (six) hours as needed for wheezing or shortness of breath.    .  fluticasone (FLONASE) 50 MCG/ACT nasal spray Place 1 spray into both nostrils daily.    Marland Kitchen. ibuprofen (ADVIL,MOTRIN) 800 MG tablet Take 800 mg by mouth 2 (two) times daily as needed for mild pain. Takes 1 every morning and [name] take the second dose if he is pain    . lamoTRIgine (LAMICTAL) 100 MG tablet Take 100 mg by mouth at bedtime.    Marland Kitchen. omeprazole (PRILOSEC) 40 MG capsule Take 40 mg by mouth daily.    . Oxycodone HCl 10 MG TABS Take 10 mg by mouth 3 (three) times daily.      No results found for this or any previous visit (from the past 48 hour(s)). No results found.  Review of Systems  Musculoskeletal: Positive for back pain and myalgias.  Neurological: Positive for tingling and sensory change.    Blood pressure 130/74, pulse 93, temperature 98.3 F (36.8 C), resp. rate 18, weight 66.2 kg (146 lb), SpO2 97 %. Physical Exam  Constitutional: He is oriented to person, place, and time. He appears well-developed.  HENT:  Head: Normocephalic.  Eyes: Pupils are equal, round, and reactive to light.  Neck: Normal range of motion.  GI: Soft.  Neurological: He is alert and oriented to person, place, and time. He has normal strength. GCS eye subscore is 4. GCS verbal subscore is 5. GCS motor subscore is 6.  Strength 5 out of 5 iliopsoas, quads, hamstrings, gastric, into tibialis, and EHL.  Skin: Skin is warm and dry.  Assessment/Plan 46 year old gentleman presents for anterolateral interbody fusion with cortical screws at L1-L2  Nathan Vacca P, MD 08/18/2016, 2:03 PM

## 2016-08-19 ENCOUNTER — Encounter (HOSPITAL_COMMUNITY): Payer: Self-pay | Admitting: Neurosurgery

## 2016-08-19 NOTE — Evaluation (Signed)
Physical Therapy Evaluation Patient Details Name: Nathan [name] MRN: [id number] DOB: 1970-05-03 Today's Date: 08/19/2016   History of Present Illness  Pt is a 46 y/o male s/p anterolateral interbody fusion through a L sided approach at L1-L2. PMH including but not limited to OA and chronic back pain.  Clinical Impression  Pt presented supine in bed with HOB elevated, awake and willing to participate in therapy session. Prior to admission, pt reported that he was independent with all functional mobility and ADLs. Pt ambulated in hallway with supervision without an AD, no instability or LOB. Pt also successfully completed stair training with no concerns. PT reviewed 3/3 back precautions with pt throughout session. No further acute PT needs identified at this time. PT signing off.    Follow Up Recommendations Outpatient PT;Other (comment) (pt reported having OP PT appointments set up)    Equipment Recommendations  None recommended by PT    Recommendations for Other Services       Precautions / Restrictions Precautions Precautions: Back Precaution Booklet Issued: No Precaution Comments: PT reviewed 3/3 back precautions with pt and log roll technique for bed mobility Required Braces or Orthoses: Spinal Brace Spinal Brace: Lumbar corset;Applied in sitting position Restrictions Weight Bearing Restrictions: No      Mobility  Bed Mobility Overal bed mobility: Needs Assistance Bed Mobility: Rolling;Sidelying to Sit;Sit to Sidelying Rolling: Supervision Sidelying to sit: Supervision     Sit to sidelying: Supervision General bed mobility comments: increased time, cueing for log roll, supervision for safety  Transfers Overall transfer level: Needs assistance Equipment used: None Transfers: Sit to/from Stand Sit to Stand: Supervision         General transfer comment: increased time and effort, supervision for safety  Ambulation/Gait Ambulation/Gait assistance:  Supervision Ambulation Distance (Feet): 300 Feet Assistive device: None Gait Pattern/deviations: Step-through pattern Gait velocity: decreased Gait velocity interpretation: Below normal speed for age/gender General Gait Details: no instability or LOB, supervision for safety  Stairs Stairs: Yes Stairs assistance: Supervision Stair Management: Two rails;Alternating pattern;Forwards Number of Stairs: 2    Wheelchair Mobility    Modified Rankin (Stroke Patients Only)       Balance Overall balance assessment: Needs assistance Sitting-balance support: Feet supported Sitting balance-Leahy Scale: Good     Standing balance support: During functional activity;No upper extremity supported Standing balance-Leahy Scale: Good                               Pertinent Vitals/Pain Pain Assessment: 0-10 Pain Score: 7  Pain Location: back Pain Descriptors / Indicators: Sore Pain Intervention(s): Monitored during session;Repositioned    Home Living Family/patient expects to be discharged to:: Private residence Living Arrangements: Parent Available Help at Discharge: Family;Available 24 hours/day Type of Home: House Home Access: Level entry     Home Layout: Other (Comment) (3 steps to get from den to [employer]) Home Equipment: Dan Humphreys - 2 wheels;Cane - single point;Crutches;Bedside commode;Wheelchair - manual      Prior Function Level of Independence: Independent               Hand Dominance        Extremity/Trunk Assessment   Upper Extremity Assessment Upper Extremity Assessment: Defer to OT evaluation    Lower Extremity Assessment Lower Extremity Assessment: Overall WFL for tasks assessed    Cervical / Trunk Assessment Cervical / Trunk Assessment: Other exceptions Cervical / Trunk Exceptions: s/p lumbar sx  Communication   Communication:  No difficulties  Cognition Arousal/Alertness: Awake/alert Behavior During Therapy: WFL for tasks  assessed/performed Overall Cognitive Status: Within Functional Limits for tasks assessed                                        General Comments      Exercises     Assessment/Plan    PT Assessment Patent does not need any further PT services  PT Problem List         PT Treatment Interventions      PT Goals (Current goals can be found in the Care Plan section)  Acute Rehab PT Goals Patient Stated Goal: return home    Frequency     Barriers to discharge        Co-evaluation               AM-PAC PT "6 Clicks" Daily Activity  Outcome Measure Difficulty turning over in bed (including adjusting bedclothes, sheets and blankets)?: A Little Difficulty moving from lying on back to sitting on the side of the bed? : A Little Difficulty sitting down on and standing up from a chair with arms (e.g., wheelchair, bedside commode, etc,.)?: A Little Help needed moving to and from a bed to chair (including a wheelchair)?: None Help needed walking in hospital room?: None Help needed climbing 3-5 steps with a railing? : None 6 Click Score: 21    End of Session Equipment Utilized During Treatment: Gait belt;Back brace Activity Tolerance: Patient tolerated treatment well Patient left: in bed;with call bell/phone within reach;with bed alarm set Nurse Communication: Mobility status PT Visit Diagnosis: Other abnormalities of gait and mobility (R26.89);Pain Pain - part of body:  (back)    Time: [id number] PT Time Calculation (min) (ACUTE ONLY): 28 min   Charges:   PT Evaluation $PT Eval Moderate Complexity: 1 Procedure PT Treatments $Gait Training: 8-22 mins   PT G Codes:        [place] Pal[place] VerdesJennifer Yoselyn Mcglade, PT, DPT [phone]   Alessandra BevelsJennifer M Harrington Jobe 08/19/2016, 3:51 PM

## 2016-08-19 NOTE — Progress Notes (Signed)
Subjective: Patient reports Patient is very well condition of back pain no leg pain  Objective: Vital signs in last 24 hours: Temp:  [97.7 F (36.5 C)-98.5 F (36.9 C)] 98.5 F (36.9 C) (06/22 0942) Pulse Rate:  [69-83] 73 (06/22 0942) Resp:  [10-20] 20 (06/22 0942) BP: (111-138)/(66-96) 117/76 (06/22 0942) SpO2:  [92 %-100 %] 99 % (06/22 0942) Weight:  [66 kg (145 lb 8.1 oz)] 66 kg (145 lb 8.1 oz) (06/21 2035)  Intake/Output from previous day: 06/21 0701 - 06/22 0700 In: 2971 [P.O.:360; I.V.:2511; IV Piggyback:100] Out: 3425 [Urine:3250; Drains:50; Blood:125] Intake/Output this shift: No intake/output data recorded.  Strength out of 5 wound is clean dry and intact  Lab Results: No results for input(s): WBC, HGB, HCT, PLT in the last 72 hours. BMET No results for input(s): NA, K, CL, CO2, GLUCOSE, BUN, CREATININE, CALCIUM in the last 72 hours.  Studies/Results: Dg Lumbar Spine 2-3 Views  Result Date: 08/18/2016 CLINICAL DATA:  L1-L2 XLIF EXAM: LUMBAR SPINE - 2-3 VIEW; DG C-ARM GT 120 MIN COMPARISON:  MRI lumbar spine 04/07/2016, chest radiograph 02/06/2010 FLUOROSCOPY TIME:  3 minutes 0 seconds Images obtained:  7 FINDINGS: Five lumbar vertebra labeled on the preoperative MR exam. Twelve pairs of ribs on remote chest exam 02/06/2010. Submitted images demonstrate placement of a disc prosthesis and BILATERAL pedicle screws at L1-L2. Endplate sclerosis and tiny endplate spurs at Z6-X01-L2. Bones appear demineralized. No bone destruction. Mild retrolisthesis at L1-L2, corresponding to prior MR. IMPRESSION: Intraoperative images during posterior fusion of L1-L2. Electronically Signed   By: Ulyses SouthwardMark  Boles M.D.   On: 08/18/2016 18:51   Dg C-arm Gt 120 Min  Result Date: 08/18/2016 CLINICAL DATA:  L1-L2 XLIF EXAM: LUMBAR SPINE - 2-3 VIEW; DG C-ARM GT 120 MIN COMPARISON:  MRI lumbar spine 04/07/2016, chest radiograph 02/06/2010 FLUOROSCOPY TIME:  3 minutes 0 seconds Images obtained:  7 FINDINGS:  Five lumbar vertebra labeled on the preoperative MR exam. Twelve pairs of ribs on remote chest exam 02/06/2010. Submitted images demonstrate placement of a disc prosthesis and BILATERAL pedicle screws at L1-L2. Endplate sclerosis and tiny endplate spurs at R6-E41-L2. Bones appear demineralized. No bone destruction. Mild retrolisthesis at L1-L2, corresponding to prior MR. IMPRESSION: Intraoperative images during posterior fusion of L1-L2. Electronically Signed   By: Ulyses SouthwardMark  Boles M.D.   On: 08/18/2016 18:51    Assessment/Plan: Continue to mobilize with physical and occupational therapy probable discharge in a.m.  LOS: 1 day     Nathan [name] P 08/19/2016, 10:54 AM

## 2016-08-19 NOTE — Evaluation (Signed)
Occupational Therapy Evaluation Patient Details Name: Nathan [name] MRN: [id number] DOB: 02-06-1971 Today's Date: 08/19/2016    History of Present Illness Pt is a 46 y/o male s/p anterolateral interbody fusion through a L sided approach at L1-L2. PMH including but not limited to OA and chronic back pain.   Clinical Impression   This 46 y/o M presents with the above. At baseline Pt is independent with ADLs and functional mobility. Pt currently requires supervision for functional mobility and MinA for LB ADLs secondary to adhering to back precautions. Pt demonstrates good understanding and follow through of precautions, with education provided on DME, AE, and compensatory techniques for safely completing ADLs. Pt will have 24 hr family assist after discharge home. All education provided and questions answered. No further acute OT needs identified at this time. Will sign off.     Follow Up Recommendations  No OT follow up;Supervision/Assistance - 24 hour    Equipment Recommendations  None recommended by OT           Precautions / Restrictions Precautions Precautions: Back Precaution Booklet Issued: Yes (comment) Precaution Comments: reviewed 3/3 back precautions with pt and log roll technique for bed mobility; issued handout  Required Braces or Orthoses: Spinal Brace Spinal Brace: Lumbar corset;Applied in sitting position Restrictions Weight Bearing Restrictions: No      Mobility Bed Mobility Overal bed mobility: Needs Assistance Bed Mobility: Rolling;Sit to Sidelying Rolling: Supervision Sidelying to sit: Supervision     Sit to sidelying: Supervision General bed mobility comments: increased time, cueing for log roll, supervision for safety  Transfers Overall transfer level: Needs assistance Equipment used: None Transfers: Sit to/from Stand Sit to Stand: Supervision         General transfer comment: increased time and effort, supervision for safety     Balance Overall balance assessment: Needs assistance Sitting-balance support: Feet supported Sitting balance-Leahy Scale: Good     Standing balance support: During functional activity;No upper extremity supported Standing balance-Leahy Scale: Good Standing balance comment: Pt demonstrating functional mobility rolling iv pole in room with supervision, no LOB                            ADL either performed or assessed with clinical judgement   ADL Overall ADL's : Needs assistance/impaired Eating/Feeding: Set up;Sitting   Grooming: Wash/dry hands;Min guard;Standing   Upper Body Bathing: Min guard;Sitting   Lower Body Bathing: Sit to/from stand;Min guard   Upper Body Dressing : Min guard;Sitting   Lower Body Dressing: Sit to/from stand;Min guard Lower Body Dressing Details (indicate cue type and reason): Pt demonstrates figure 4 technique to don/doff socks  Toilet Transfer: Min guard;Ambulation;Regular Toilet;Grab bars Toilet Transfer Details (indicate cue type and reason): Pt has BSC at home, educated pt on using DME to increase safety/stability during toilet transfer at home  Toileting- ArchitectClothing Manipulation and Hygiene: Min guard;Sit to/from Nurse, children'sstand     Tub/Shower Transfer Details (indicate cue type and reason): Educated Pt on transfer technique for tub transfer; educated Pt on using shower chair to increase safety during bathing and adherence to back precautions during task completion Functional mobility during ADLs: Min guard General ADL Comments: educated Pt on DME, AE, and compensatory techniques for completing ADLs while adhering to back precautions                          Pertinent Vitals/Pain Pain Assessment: Faces Pain Score: 7  Faces Pain Scale: Hurts a little bit Pain Location: back Pain Descriptors / Indicators: Sore;Aching Pain Intervention(s): Limited activity within patient's tolerance;Monitored during session;Repositioned           Extremity/Trunk Assessment Upper Extremity Assessment Upper Extremity Assessment: Overall WFL for tasks assessed   Lower Extremity Assessment Lower Extremity Assessment: Defer to PT evaluation   Cervical / Trunk Assessment Cervical / Trunk Assessment: Other exceptions Cervical / Trunk Exceptions: s/p lumbar sx   Communication Communication Communication: No difficulties   Cognition Arousal/Alertness: Awake/alert Behavior During Therapy: WFL for tasks assessed/performed Overall Cognitive Status: Within Functional Limits for tasks assessed                                     General Comments                  Home Living Family/patient expects to be discharged to:: Private residence Living Arrangements: Parent Available Help at Discharge: Family;Available 24 hours/day Type of Home: House Home Access: Level entry     Home Layout: Other (Comment) (3 steps from den to [employer] )     Bathroom Shower/Tub: Tub/shower unit         Home Equipment: Environmental consultant - 2 wheels;Cane - single point;Crutches;Bedside commode;Wheelchair - manual;Shower seat          Prior Functioning/Environment Level of Independence: Independent                 OT Problem List: Decreased strength;Decreased activity tolerance;Decreased knowledge of precautions;Decreased knowledge of use of DME or AE      OT Treatment/Interventions:      OT Goals(Current goals can be found in the care plan section) Acute Rehab OT Goals Patient Stated Goal: return home OT Goal Formulation: With patient                                 AM-PAC PT "6 Clicks" Daily Activity     Outcome Measure Help from another person eating meals?: None Help from another person taking care of personal grooming?: A Little Help from another person toileting, which includes using toliet, bedpan, or urinal?: A Little Help from another person bathing (including washing, rinsing, drying)?: A Little Help  from another person to put on and taking off regular upper body clothing?: A Little Help from another person to put on and taking off regular lower body clothing?: A Little 6 Click Score: 19   End of Session Nurse Communication: Mobility status  Activity Tolerance: Patient tolerated treatment well Patient left: in bed;with call bell/phone within reach  OT Visit Diagnosis: Muscle weakness (generalized) (M62.81)                Time: 5784-6962 OT Time Calculation (min): 25 min Charges:  OT General Charges $OT Visit: 1 Procedure OT Evaluation $OT Eval Low Complexity: 1 Procedure G-Codes:     Marcy Siren, OT Pager [phone] 08/19/2016   Orlando Penner 08/19/2016, 4:30 PM

## 2016-08-20 ENCOUNTER — Inpatient Hospital Stay (HOSPITAL_COMMUNITY): Payer: Medicaid Other

## 2016-08-20 DIAGNOSIS — M79609 Pain in unspecified limb: Secondary | ICD-10-CM

## 2016-08-20 NOTE — Progress Notes (Signed)
Patient reports swelling in right lower extremity behind knee. States it looks swollen to him, c/o tenderness. No redness. NP notified. LED ordered.

## 2016-08-20 NOTE — Progress Notes (Signed)
Patient ID: Nathan [name], male   DOB: 1970/10/14, 46 y.o.   MRN: [id number] Patient condition a pain all the back but also some left-sided pleuritic pain  Strength out of 5 wound clean dry and intact patient comfortable not short of breath  Continue to mobilize today with physical occupational therapy in his brace will check a PA and lateral chest x-ray although I do think most of his left-sided pleuritic pain is all musculoskeletal from the incision but will rule out any underlying fluid atelectasis. Or pneumothorax.

## 2016-08-20 NOTE — Progress Notes (Signed)
VASCULAR LAB PRELIMINARY  PRELIMINARY  PRELIMINARY  PRELIMINARY  Bilateral lower extremity venous duplex completed.    Preliminary report:  There is no DVT or SVT noted in the bilateral lower extremities.   Gerasimos Plotts, RVT 08/20/2016, 4:23 PM

## 2016-08-21 MED ORDER — OXYCODONE HCL 10 MG PO TABS: 10.0000 mg | ORAL_TABLET | ORAL | 0 refills | Status: AC | PRN

## 2016-08-21 NOTE — Progress Notes (Signed)
Patient discharge teaching complete. Meds, diet, follow up appointments and incision care reviewed and all questions answered. Copy of instructions and prescription given to patient. Patient discharged via wheelchair with mother.

## 2016-08-21 NOTE — Progress Notes (Signed)
Patient ID: Nathan [name], male   DOB: 01/08/1971, 46 y.o.   MRN: [id number] Patient doing better pain better controlled   neurologically stable right knee some localized swelling seems intrinsic in the joint itself does not appear to be concerning for DVT.  Discharge home scheduled outpatient follow-up with me as well as we'll arrange outpatient orthopedic follow-up for his right knee. Chest x-ray was stable

## 2016-08-21 NOTE — Discharge Instructions (Signed)
No lifting no bending no twisting no driving a riding a car unless he is coming back and forth to see me. Keep the incision clean dry and intact. Cover the bandages with saran wrap are present still for showers only.

## 2016-08-21 NOTE — Discharge Summary (Signed)
  Physician Discharge Summary  Patient ID: Nathan [name] MRN: [id number] DOB/AGE: 05-13-1970 46 y.o.  Admit date: 08/18/2016 Discharge date: 08/21/2016  Admission Diagnoses:Degenerative disease or spinal stenosis and instability L1-L2  Discharge Diagnoses: Same Active Problems:   Spinal stenosis of lumbar region   Discharged Condition: good  Hospital Course: Patient admitted hospital underwent the aforementioned procedure postoperative patient did very well recovered in the floor on the floor was angling and voiding spontaneously tolerating a regular diet stable for discharge home. Has some swelling in his leg underwent ultrasound ruled out DVT think this might be more intrinsic in the joint itself will arrange outpatient orthopedic follow-up  Consults: Significant Diagnostic Studies: Treatments: L1-L2 anterior lateral interbody fusion with posterior cortical screws. Discharge Exam: Blood pressure 117/71, pulse 88, temperature 99.4 F (37.4 C), temperature source Axillary, resp. rate 20, height 5\' 10"  (1.778 m), weight 66 kg (145 lb 8.1 oz), SpO2 95 %. Strength out of 5 wound clean dry and intact  Disposition: Home   Allergies as of 08/21/2016      Reactions   No Known Allergies       Medication List    TAKE these medications   albuterol 108 (90 Base) MCG/ACT inhaler Commonly known as:  PROVENTIL HFA;VENTOLIN HFA Inhale 2 puffs into the lungs every 6 (six) hours as needed for wheezing or shortness of breath.   fluticasone 50 MCG/ACT nasal spray Commonly known as:  FLONASE Place 1 spray into both nostrils daily.   ibuprofen 800 MG tablet Commonly known as:  ADVIL,MOTRIN Take 800 mg by mouth 2 (two) times daily as needed for mild pain. Takes 1 every morning and may take the second dose if he is pain   lamoTRIgine 100 MG tablet Commonly known as:  LAMICTAL Take 100 mg by mouth at bedtime.   omeprazole 40 MG capsule Commonly known as:  PRILOSEC Take 40 mg by  mouth daily.   Oxycodone HCl 10 MG Tabs Take 10 mg by mouth 3 (three) times daily. What changed:  Another medication with the same name was added. Make sure you understand how and when to take each.   Oxycodone HCl 10 MG Tabs Take 1 tablet (10 mg total) by mouth every 4 (four) hours as needed for severe pain. What changed:  You were already taking a medication with the same name, and this prescription was added. Make sure you understand how and when to take each.        Signed: Karion Cudd P 08/21/2016, 8:52 AM

## 2016-08-22 NOTE — Anesthesia Postprocedure Evaluation (Signed)
Anesthesia Post Note  Patient: Nathan [name]  Procedure(s) Performed: Procedure(s) (LRB): Lumbar One-Two ANTERIOR LATERAL LUMBAR FUSION with Cortical Screws (N/A) LUMBAR PERCUTANEOUS PEDICLE SCREW LUMBAR ONE-TWO (N/A)     Patient location during evaluation: PACU Anesthesia Type: General Level of consciousness: awake Pain management: pain level controlled Vital Signs Assessment: post-procedure vital signs reviewed and stable Respiratory status: spontaneous breathing, nonlabored ventilation, respiratory function stable and patient connected to nasal cannula oxygen Cardiovascular status: stable Postop Assessment: no signs of nausea or vomiting Anesthetic complications: no    Last Vitals:  Vitals:   08/21/16 0558 08/21/16 1007  BP: 117/71 125/70  Pulse: 88 88  Resp: 20 18  Temp: 37.4 C 37.1 C    Last Pain:  Vitals:   08/21/16 1007  TempSrc: Axillary  PainSc:                  Criss Emilyrose Darrah

## 2017-10-25 ENCOUNTER — Other Ambulatory Visit: Payer: Self-pay | Admitting: Neurosurgery

## 2017-10-25 DIAGNOSIS — M5412 Radiculopathy, cervical region: Secondary | ICD-10-CM

## 2018-04-26 IMAGING — RF DG LUMBAR SPINE 2-3V
1 series · 7 of 7 positions shown · non-contrast
Comparison: MRI lumbar spine 04/07/2016, chest radiograph
02/06/2010

FLUOROSCOPY TIME:  3 minutes 0 seconds

Images obtained:  7

CLINICAL DATA: L1-L2 XLIF

EXAM:
LUMBAR SPINE - 2-3 VIEW; DG C-ARM GT 120 MIN

[Series 1: run · 7 of 7 slices shown]
[im 1/7]
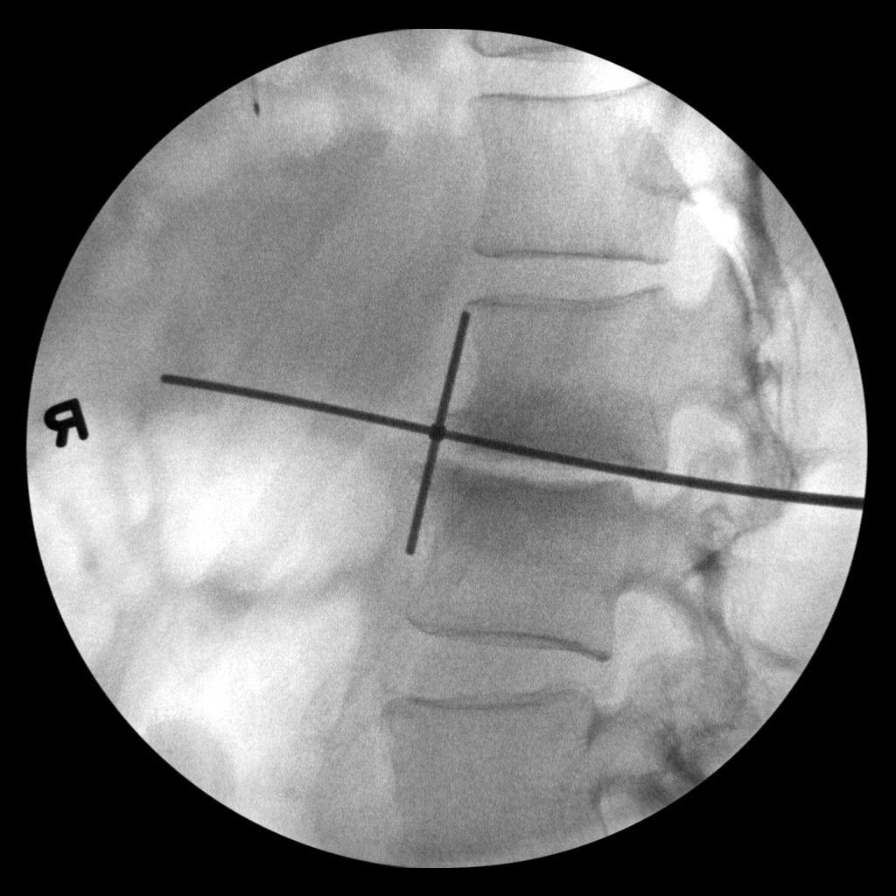
[im 2/7]
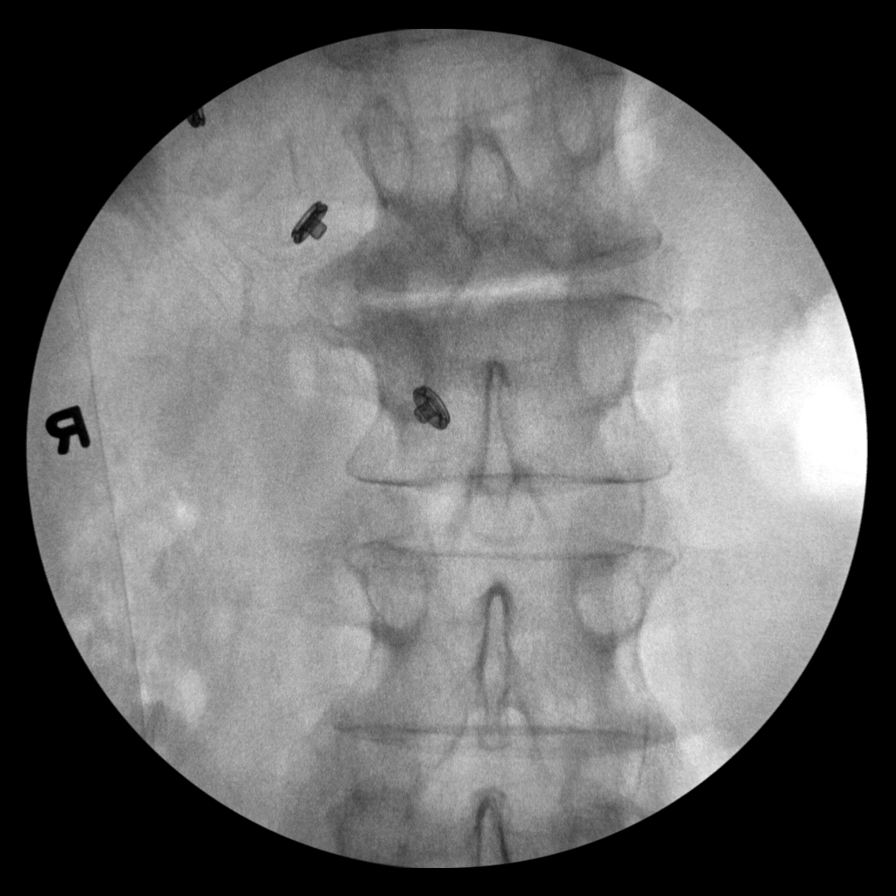
[im 3/7]
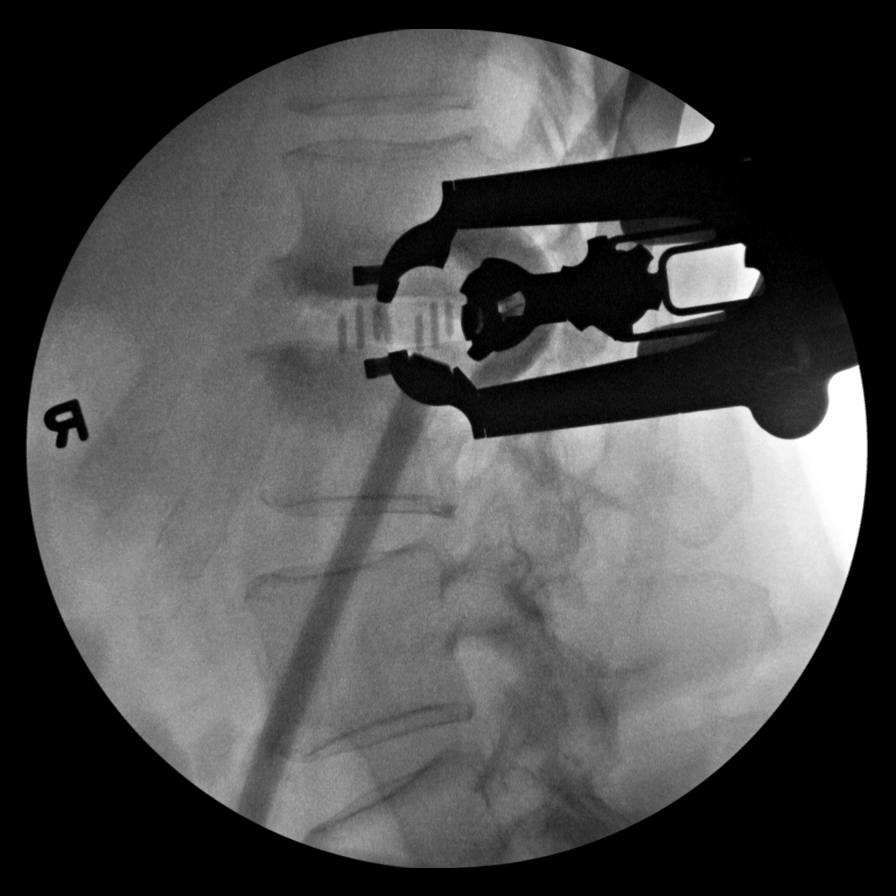
[im 4/7]
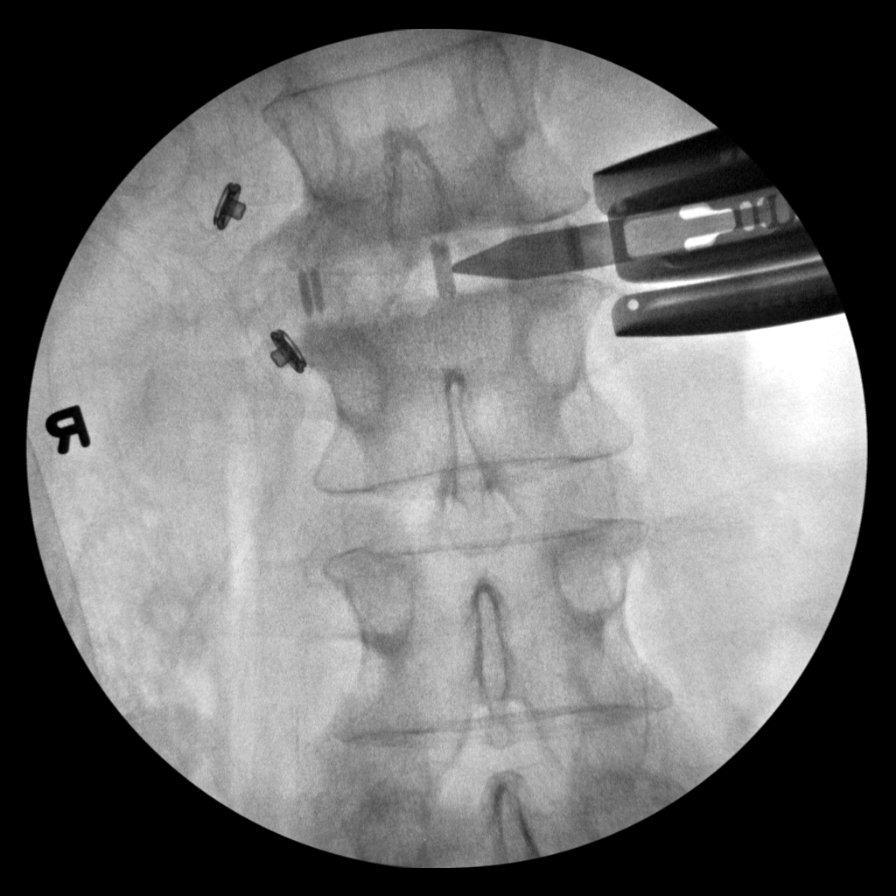
[im 5/7]
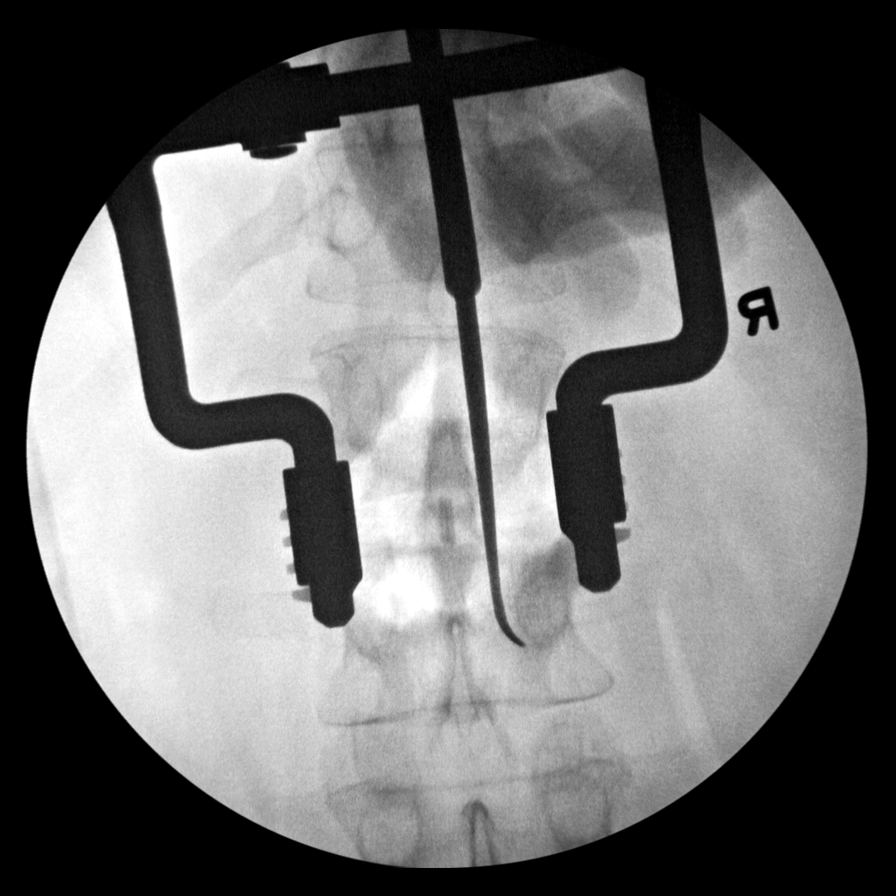
[im 6/7]
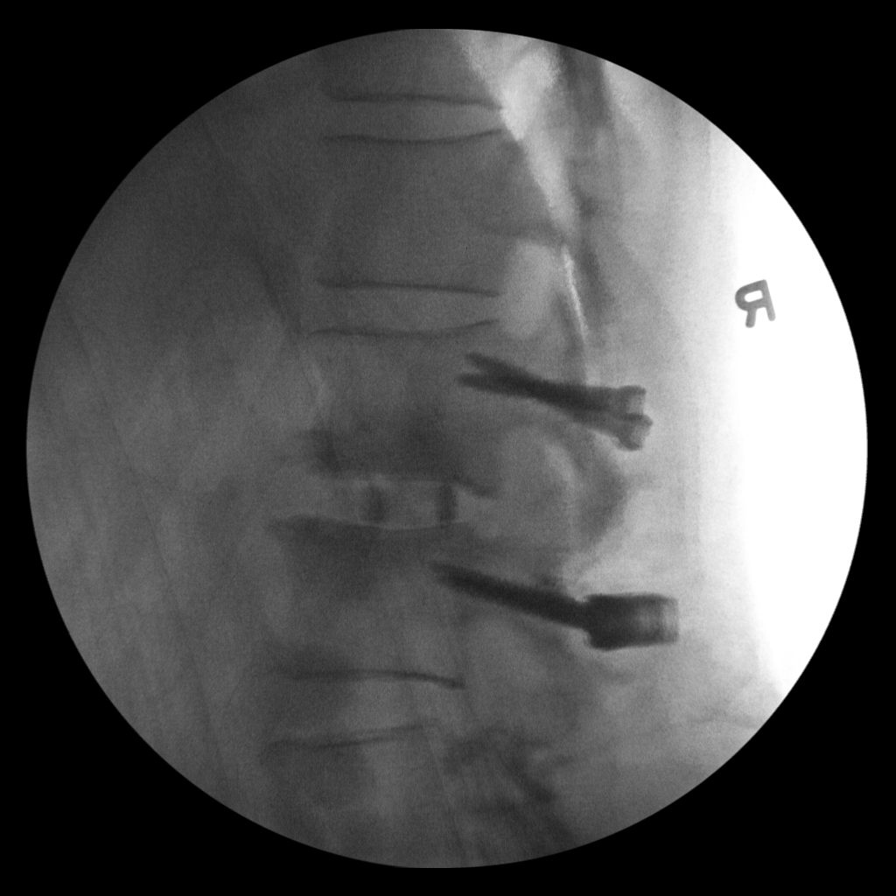
[im 7/7]
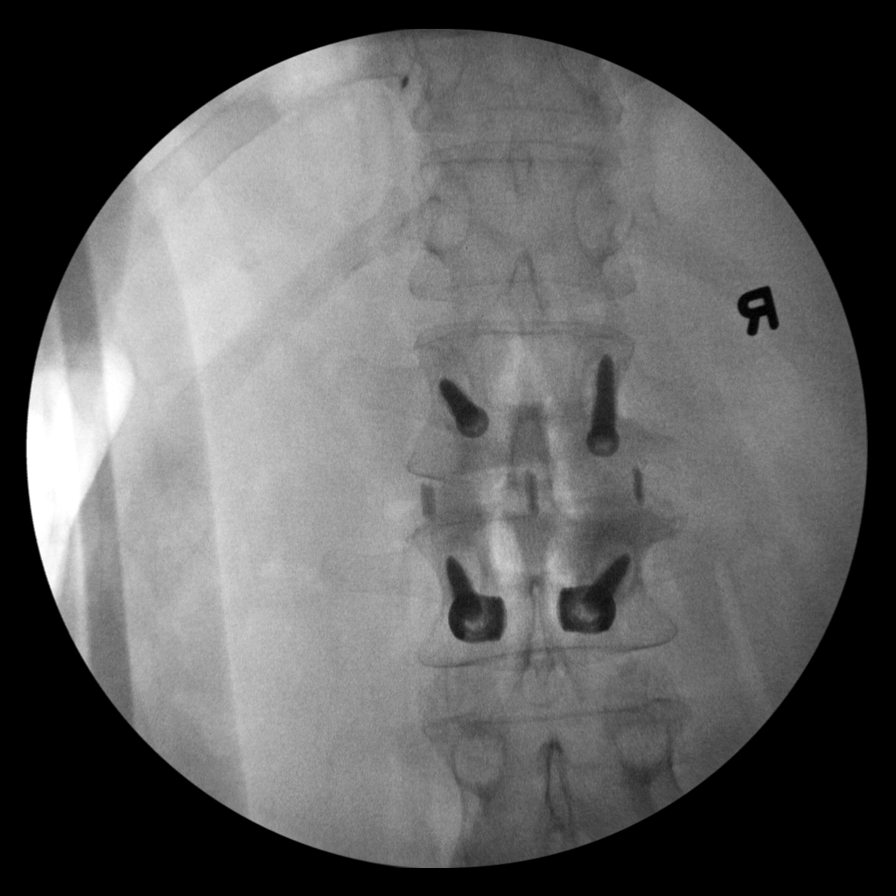

[7 of 7 positions shown; findings below may reference images not displayed]

FINDINGS: Five lumbar vertebra labeled on the preoperative MR exam.

Twelve pairs of ribs on remote chest exam 02/06/2010.

Submitted images demonstrate placement of a disc prosthesis and
BILATERAL pedicle screws at L1-L2.

Endplate sclerosis and tiny endplate spurs at L1-L2.

Bones appear demineralized.

No bone destruction.

Mild retrolisthesis at L1-L2, corresponding to prior MR.
IMPRESSION: Intraoperative images during posterior fusion of L1-L2.

## 2018-04-28 IMAGING — DX DG CHEST 2V
2 series · 2 of 2 positions shown · non-contrast
Comparison: 02/08/2010 and CT thoracic spine 07/10/2015

CLINICAL DATA: Pleuritic chest pain. Back surgery 3 days ago with
access via left flank.

EXAM:
CHEST  2 VIEW

[chest pa]
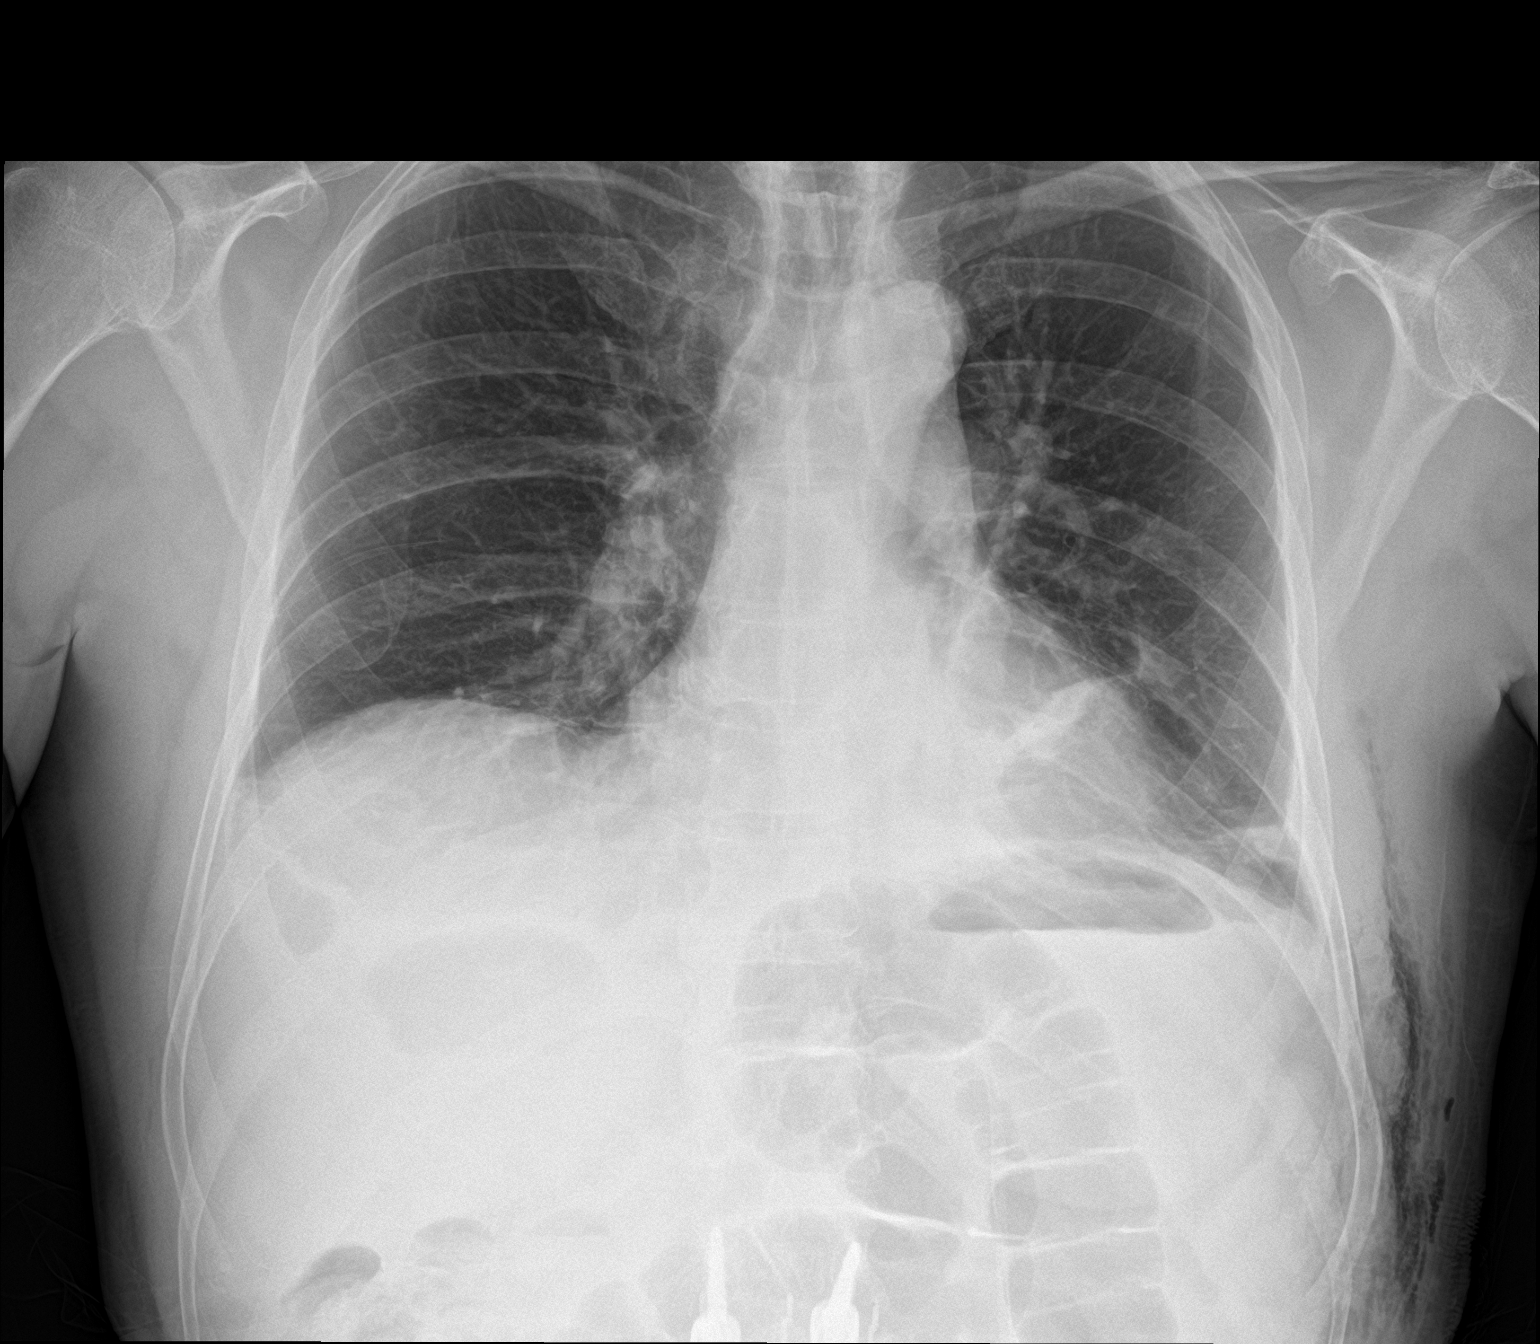

[chest lat]
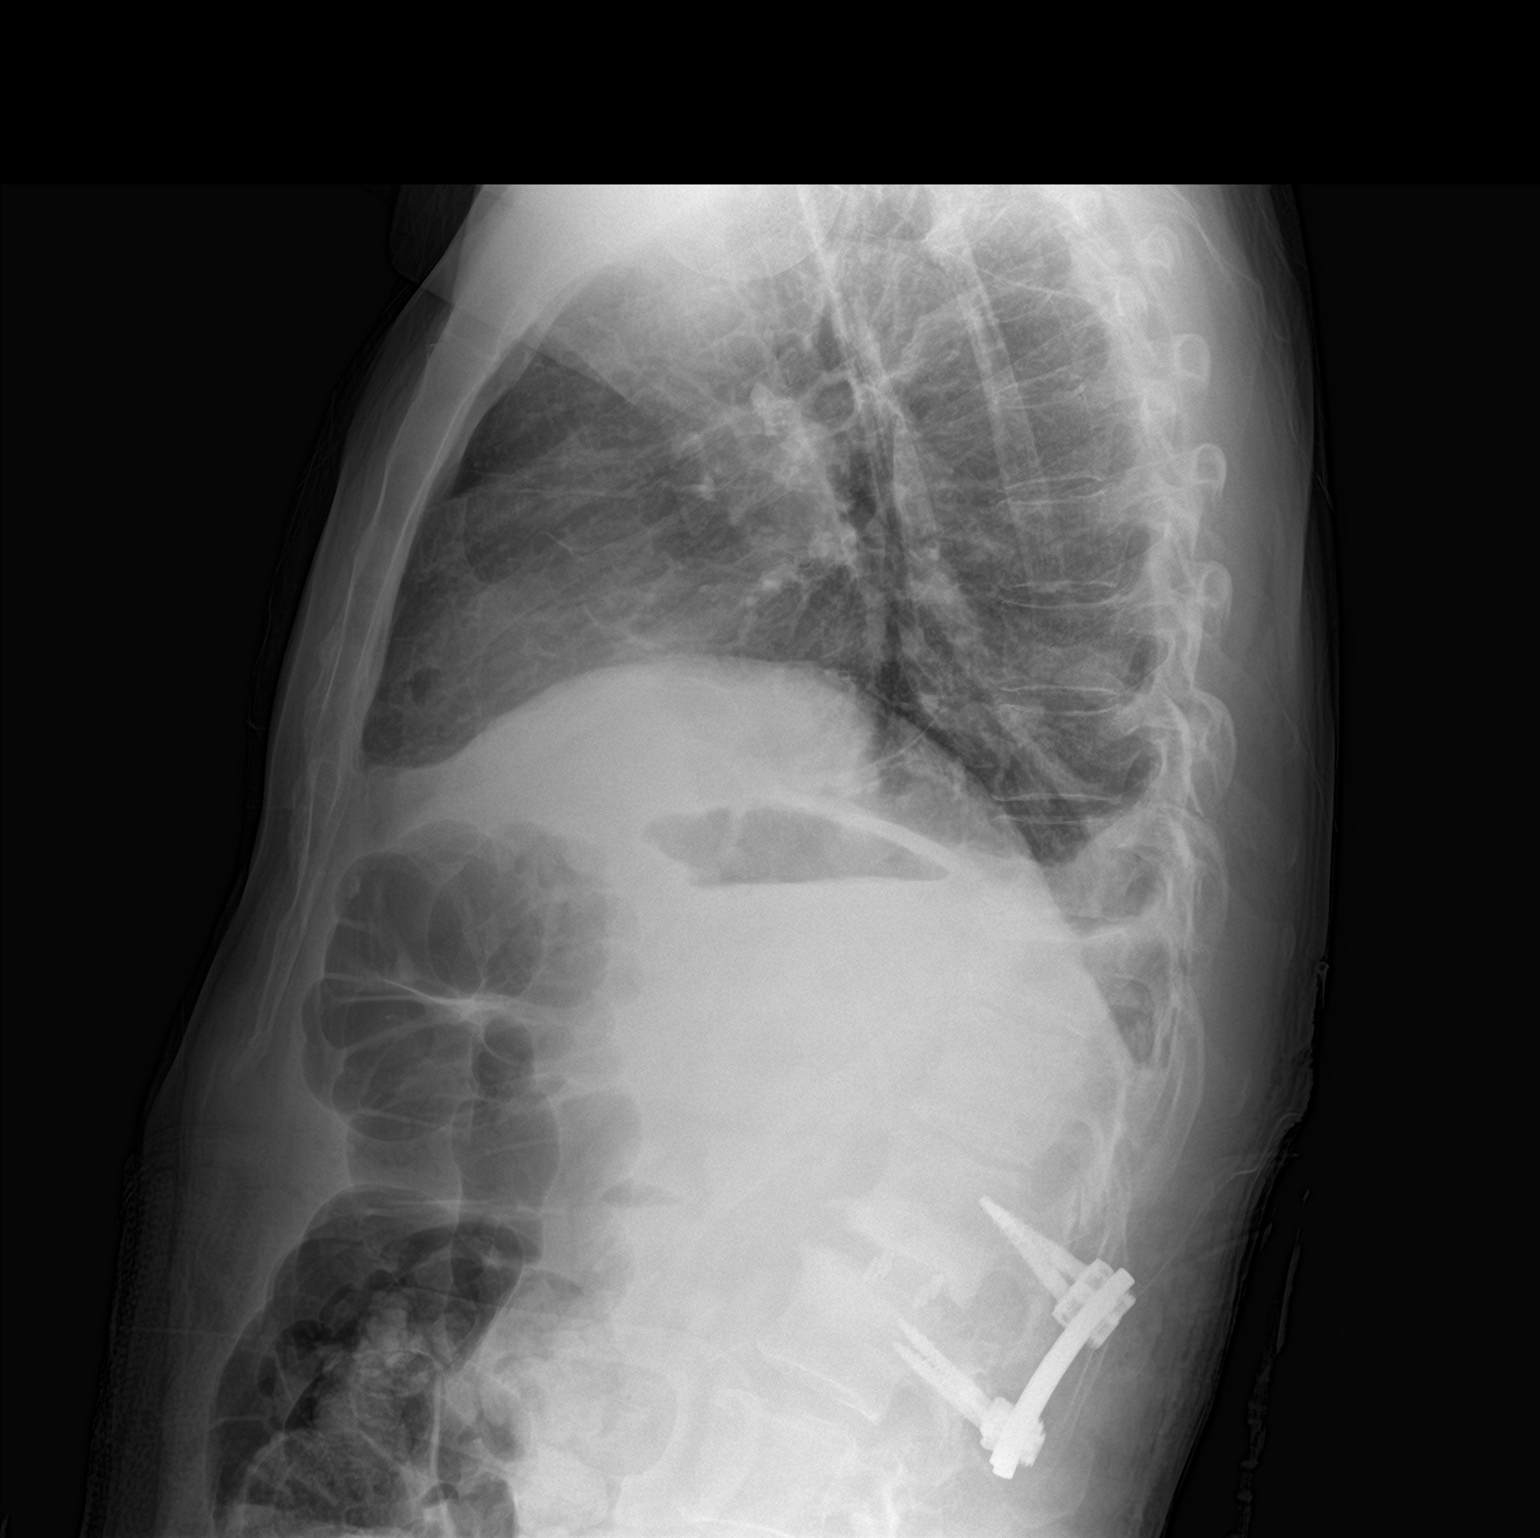

[2 of 2 positions shown; findings below may reference images not displayed]

FINDINGS: Lungs are hypoinflated with left basilar opacification likely
atelectasis although cannot exclude infection. Small amount left
pleural fluid posteriorly. No pneumothorax. Cardiomediastinal
silhouette is within normal. T11 compression fracture unchanged.
Posterior fusion hardware at the L1-2 level.
IMPRESSION: Hypoinflated lungs with left basilar opacification likely
atelectasis, although cannot exclude infection.

Stable T11 compression fracture.
